# Patient Record
Sex: Female | Born: 1990 | Race: Asian | Marital: Single | Smoking: Never smoker
Health system: Southern US, Community
[De-identification: ages and names within clinical notes are randomized; demographics above are authoritative.]

---
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
## 2019-07-10 ENCOUNTER — Other Ambulatory Visit: Payer: Self-pay

## 2019-07-13 ENCOUNTER — Ambulatory Visit: Payer: Self-pay | Admitting: Physician Assistant

## 2019-08-17 ENCOUNTER — Ambulatory Visit (INDEPENDENT_AMBULATORY_CARE_PROVIDER_SITE_OTHER): Payer: 59 | Admitting: Physician Assistant

## 2019-08-17 ENCOUNTER — Other Ambulatory Visit: Payer: Self-pay

## 2019-08-17 ENCOUNTER — Encounter: Payer: Self-pay | Admitting: Physician Assistant

## 2019-08-17 VITALS — BP 122/88 | HR 96 | Temp 98.1°F | Ht <= 58 in | Wt 101.5 lb

## 2019-08-17 DIAGNOSIS — Z111 Encounter for screening for respiratory tuberculosis: Secondary | ICD-10-CM

## 2019-08-17 DIAGNOSIS — Z114 Encounter for screening for human immunodeficiency virus [HIV]: Secondary | ICD-10-CM | POA: Diagnosis not present

## 2019-08-17 DIAGNOSIS — Z1159 Encounter for screening for other viral diseases: Secondary | ICD-10-CM

## 2019-08-17 DIAGNOSIS — R05 Cough: Secondary | ICD-10-CM | POA: Diagnosis not present

## 2019-08-17 DIAGNOSIS — Z23 Encounter for immunization: Secondary | ICD-10-CM

## 2019-08-17 DIAGNOSIS — R059 Cough, unspecified: Secondary | ICD-10-CM

## 2019-08-17 DIAGNOSIS — R634 Abnormal weight loss: Secondary | ICD-10-CM

## 2019-08-17 LAB — CBC WITH DIFFERENTIAL/PLATELET
Basophils Absolute: 0.1 10*3/uL (ref 0.0–0.1)
Basophils Relative: 1 % (ref 0.0–3.0)
Eosinophils Absolute: 0.1 10*3/uL (ref 0.0–0.7)
Eosinophils Relative: 2.4 % (ref 0.0–5.0)
HCT: 34.1 % — ABNORMAL LOW (ref 36.0–46.0)
Hemoglobin: 10.9 g/dL — ABNORMAL LOW (ref 12.0–15.0)
Lymphocytes Relative: 34 % (ref 12.0–46.0)
Lymphs Abs: 1.9 10*3/uL (ref 0.7–4.0)
MCHC: 32 g/dL (ref 30.0–36.0)
MCV: 81.8 fl (ref 78.0–100.0)
Monocytes Absolute: 0.6 10*3/uL (ref 0.1–1.0)
Monocytes Relative: 10.7 % (ref 3.0–12.0)
Neutro Abs: 3 10*3/uL (ref 1.4–7.7)
Neutrophils Relative %: 51.9 % (ref 43.0–77.0)
Platelets: 422 10*3/uL — ABNORMAL HIGH (ref 150.0–400.0)
RBC: 4.17 Mil/uL (ref 3.87–5.11)
RDW: 13.5 % (ref 11.5–15.5)
WBC: 5.7 10*3/uL (ref 4.0–10.5)

## 2019-08-17 LAB — COMPREHENSIVE METABOLIC PANEL
ALT: 9 U/L (ref 0–35)
AST: 15 U/L (ref 0–37)
Albumin: 4.5 g/dL (ref 3.5–5.2)
Alkaline Phosphatase: 69 U/L (ref 39–117)
BUN: 13 mg/dL (ref 6–23)
CO2: 26 mEq/L (ref 19–32)
Calcium: 9.4 mg/dL (ref 8.4–10.5)
Chloride: 106 mEq/L (ref 96–112)
Creatinine, Ser: 0.63 mg/dL (ref 0.40–1.20)
GFR: 112.38 mL/min (ref >60.00)
Glucose, Bld: 93 mg/dL (ref 70–99)
Potassium: 4.5 mEq/L (ref 3.5–5.1)
Sodium: 138 mEq/L (ref 135–145)
Total Bilirubin: 0.5 mg/dL (ref 0.2–1.2)
Total Protein: 7.3 g/dL (ref 6.0–8.3)

## 2019-08-17 LAB — TSH: TSH: 2.42 u[IU]/mL (ref 0.35–4.50)

## 2019-08-17 MED ORDER — ALBUTEROL SULFATE HFA 108 (90 BASE) MCG/ACT IN AERS
2.0000 | INHALATION_SPRAY | Freq: Four times a day (QID) | RESPIRATORY_TRACT | 1 refills | Status: DC | PRN
Start: 2019-08-17 — End: 2021-09-21

## 2019-08-17 MED ORDER — PREDNISONE 20 MG PO TABS: 40.0000 mg | ORAL_TABLET | Freq: Every day | ORAL | 0 refills | Status: DC

## 2019-08-17 NOTE — Progress Notes (Signed)
Theresa Greer is a 29 y.o. female is here to establish care.  I acted as a Neurosurgeon for [employer], PA-C Corky Mull, LPN   History of Present Illness:   Chief Complaint  Patient presents with  . Establish Care  . Cough    HPI   Pt is here to establish care today.  Cough Pt is c/o dry non-productive cough x 1 month. Denies fever or chills, no nasal congestion. She has tried OTC (including antihistamine, mucinex) and home remedies with no relief. Smoked marijuana about 3 years ago.  No current smoking.  She reports that every year she gets this issue with these bronchospasms, and has never had work-up for it.  The more she talks the more she feels like she needs to cough.  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  Denies night sweats, abdominal pain, changes in bowels.  She is adopted and does not know her family history      Health Maintenance Due  Topic Date Due  . Hepatitis C Screening  Never done  . HIV Screening  Never done  . PAP-Cervical Cytology Screening  Never done  . PAP SMEAR-Modifier  Never done    History reviewed. No pertinent past medical history.   Social History   Tobacco Use  . Smoking status: Never Smoker  . Smokeless tobacco: Never Used  Vaping Use  . Vaping Use: Never used  Substance Use Topics  . Alcohol use: Yes    Comment: 2 drinks a week  . Drug use: Never    Past Surgical History:  Procedure Laterality Date  . WISDOM TOOTH EXTRACTION Bilateral 2012    Family History  Adopted: Yes  Family history unknown: Yes    PMHx, SurgHx, SocialHx, FamHx, Medications, and Allergies were reviewed in the Visit Navigator and updated as appropriate.   There are no problems to display for this patient.   Social History   Tobacco Use  . Smoking status: Never Smoker  .  Smokeless tobacco: Never Used  Vaping Use  . Vaping Use: Never used  Substance Use Topics  . Alcohol use: Yes    Comment: 2 drinks a week  . Drug use: Never    Current Medications and Allergies:    Current Outpatient Medications:  .  albuterol (VENTOLIN HFA) 108 (90 Base) MCG/ACT inhaler, Inhale 2 puffs into the lungs every 6 (six) hours as needed for wheezing or shortness of breath., Disp: 18 g, Rfl: 1 .  predniSONE (DELTASONE) 20 MG tablet, Take 2 tablets (40 mg total) by mouth daily., Disp: 10 tablet, Rfl: 0  No Known Allergies  Review of Systems   ROS Negative unless otherwise specified per HPI.  Vitals:   Vitals:   08/17/19 1058  BP: 122/88  Pulse: 96  Temp: 98.1 F (36.7 C)  TempSrc: Temporal  SpO2: 99%  Weight: 101 lb 8 oz (46 kg)  Height: 4' 9.75" (1.467 m)     Body mass index is 21.4 kg/m.   Physical Exam:    Physical Exam Vitals and nursing note reviewed.  Constitutional:      General: She is not in acute distress.    Appearance: She is well-developed. She is not ill-appearing or toxic-appearing.  Cardiovascular:     Rate and Rhythm: Normal rate  and regular rhythm.     Pulses: Normal pulses.     Heart sounds: Normal heart sounds, S1 normal and S2 normal.     Comments: No LE edema Pulmonary:     Effort: Pulmonary effort is normal.     Breath sounds: Normal breath sounds.  Skin:    General: Skin is warm and dry.  Neurological:     Mental Status: She is alert.     GCS: GCS eye subscore is 4. GCS verbal subscore is 5. GCS motor subscore is 6.  Psychiatric:        Speech: Speech normal.        Behavior: Behavior normal. Behavior is cooperative.      Assessment and Plan:    Kaedyn was seen today for establish care and cough.  Diagnoses and all orders for this visit:  Cough Possible seasonal allergy related cough vs upper airway cough syndrome vs other. Advise patient as follows -Start oral prednisone. -Start daily over the counter  antihistamines such as Zyrtec (cetirizine), Claritin (loratadine), Allegra (fexofenadine), or Xyzal (levocetirizine). -Use albuterol inhaler as needed for shortness of breath/severe coughing episodes -Start daily medication for your stomach such as pepcid or nexium (generic is fine!)  I also advised her to follow-up with me in 2 weeks to follow-up on this.  At that time we may consider chest x-ray and pulmonary consult. -     CBC with Differential/Platelet -     Comprehensive metabolic panel  Weight loss Encouraged healthy eating and adequate calorie consumption throughout the day.  Advised patient try to prevent further weight loss and that she is currently in a healthy weight.  Will follow up on her weight in 2 weeks as well. -     TSH  Screening for tuberculosis -     QuantiFERON-TB Gold Plus  Screening for HIV (human immunodeficiency virus) -     HIV Antibody (routine testing w rflx)  Encounter for screening for other viral diseases -     Hepatitis C antibody  Need for prophylactic vaccination with combined diphtheria-tetanus-pertussis (DTP) vaccine -     Tdap vaccine greater than or equal to 7yo IM  Other orders -     predniSONE (DELTASONE) 20 MG tablet; Take 2 tablets (40 mg total) by mouth daily. -     albuterol (VENTOLIN HFA) 108 (90 Base) MCG/ACT inhaler; Inhale 2 puffs into the lungs every 6 (six) hours as needed for wheezing or shortness of breath.    . Reviewed expectations re: course of current medical issues. . Discussed self-management of symptoms. . Outlined signs and symptoms indicating need for more acute intervention. . Patient verbalized understanding and all questions were answered. . See orders for this visit as documented in the electronic medical record. . Patient received an After Visit Summary.  CMA or LPN served as scribe during this visit. History, Physical, and Plan performed by medical provider. The above documentation has been reviewed and is  accurate and complete.   Jarold Motto, PA-C [place] 08/17/2019  Follow-up: No follow-ups on file.

## 2019-08-17 NOTE — Patient Instructions (Signed)
It was great to see you!  -Start oral prednisone. -Start daily over the counter antihistamines such as Zyrtec (cetirizine), Claritin (loratadine), Allegra (fexofenadine), or Xyzal (levocetirizine). -Use inhaler as needed for shortness of breath/severe coughing episodes -Start daily medication for your stomach such as pepcid or nexium (generic is fine!)   Follow-up with me in two weeks!  Take care,  Jarold Motto PA-C  Bronchospasm, Adult  Bronchospasm is when airways in the lungs get smaller. When this happens, it can be hard to breathe. You may cough. You may also make a whistling sound when you breathe (wheeze). Follow these instructions at home: Medicines  Take over-the-counter and prescription medicines only as told by your doctor.  If you need to use an inhaler or nebulizer to take your medicine, ask your doctor how to use it.  If you were given a spacer, always use it with your inhaler. Lifestyle  Change your heating and air conditioning filter. Do this at least once a month.  Try not to use fireplaces and wood stoves.  Do not  smoke. Do not  allow smoking in your home.  Try not to use things that have a strong smell, like perfume.  Get rid of pests (such as roaches and mice) and their poop.  Remove any mold from your home.  Keep your house clean. Get rid of dust.  Use cleaning products that have no smell.  Replace carpet with wood, tile, or vinyl flooring.  Use allergy-proof pillows, mattress covers, and box spring covers.  Wash bed sheets and blankets every week. Use hot water. Dry them in a dryer.  Use blankets that are made of polyester or cotton.  Wash your hands often.  Keep pets out of your bedroom.  When you exercise, try not to breathe in cold air. General instructions  Have a plan for getting medical care. Know these things: ? When to call your doctor. ? When to call local emergency services (911 in the [place] ? Where to go in an  emergency.  Stay up to date on your shots (immunizations).  When you have an episode: ? Stay calm. ? Relax. ? Breathe slowly. Contact a doctor if:  Your muscles ache.  Your chest hurts.  The color of the mucus you cough up (sputum) changes from clear or white to yellow, green, gray, or bloody.  The mucus you cough up gets thicker.  You have a fever. Get help right away if:  The whistling sound gets worse, even after you take your medicines.  Your coughing gets worse.  You find it even harder to breathe.  Your chest hurts very much. Summary  Bronchospasm is when airways in the lungs get smaller.  When this happens, it can be hard to breathe. You may cough. You may also make a whistling sound when you breathe.  Stay away from things that cause you to have episodes. These include smoke or dust. This information is not intended to replace advice given to you by your health care provider. Make sure you discuss any questions you have with your health care provider. Document Revised: 12/28/2016 Document Reviewed: 01/19/2016 Elsevier Patient Education  2020 [employer].

## 2019-08-18 ENCOUNTER — Telehealth: Payer: Self-pay | Admitting: Physician Assistant

## 2019-08-18 NOTE — Telephone Encounter (Signed)
Patient has dropped off Immunization form to be completed for school.  I have placed this in Clinical Pickup.  Please call patient once complete.

## 2019-08-19 LAB — QUANTIFERON-TB GOLD PLUS
Mitogen-NIL: >10 IU/mL
NIL: 0.15 IU/mL
QuantiFERON-TB Gold Plus: NEGATIVE
TB1-NIL: 0 IU/mL
TB2-NIL: <0 IU/mL

## 2019-08-19 LAB — HEPATITIS C ANTIBODY
Hepatitis C Ab: NONREACTIVE
SIGNAL TO CUT-OFF: 0.32 (ref <1.00)

## 2019-08-19 LAB — HIV ANTIBODY (ROUTINE TESTING W REFLEX): HIV 1&2 Ab, 4th Generation: NONREACTIVE

## 2019-08-20 ENCOUNTER — Telehealth: Payer: Self-pay | Admitting: Physician Assistant

## 2019-08-20 DIAGNOSIS — Z0184 Encounter for antibody response examination: Secondary | ICD-10-CM

## 2019-08-20 NOTE — Telephone Encounter (Signed)
Patient requested a copy of her immunization records she would like to come pick them up

## 2019-08-20 NOTE — Telephone Encounter (Signed)
Noted. Pt notified that we do not have all of her immunizations from [place]. Told her what was needed and she will try to get dates.

## 2019-08-21 NOTE — Telephone Encounter (Signed)
LVM for patient to return call.   Pt need to make a lab only appt for varicella titer to complete required immunization record for school.

## 2019-08-24 NOTE — Telephone Encounter (Signed)
Spoke with pt, she will just come in on 09/01/19 to get varicella titer done.

## 2019-09-01 ENCOUNTER — Encounter: Payer: Self-pay | Admitting: Physician Assistant

## 2019-09-01 ENCOUNTER — Other Ambulatory Visit: Payer: Self-pay

## 2019-09-01 ENCOUNTER — Ambulatory Visit (INDEPENDENT_AMBULATORY_CARE_PROVIDER_SITE_OTHER): Payer: 59 | Admitting: Physician Assistant

## 2019-09-01 VITALS — BP 110/72 | HR 91 | Temp 97.6°F | Ht <= 58 in | Wt 106.9 lb

## 2019-09-01 DIAGNOSIS — D509 Iron deficiency anemia, unspecified: Secondary | ICD-10-CM

## 2019-09-01 DIAGNOSIS — R94120 Abnormal auditory function study: Secondary | ICD-10-CM | POA: Diagnosis not present

## 2019-09-01 DIAGNOSIS — Z23 Encounter for immunization: Secondary | ICD-10-CM

## 2019-09-01 DIAGNOSIS — R05 Cough: Secondary | ICD-10-CM

## 2019-09-01 DIAGNOSIS — R059 Cough, unspecified: Secondary | ICD-10-CM

## 2019-09-01 DIAGNOSIS — R634 Abnormal weight loss: Secondary | ICD-10-CM | POA: Diagnosis not present

## 2019-09-01 MED ORDER — QVAR REDIHALER 80 MCG/ACT IN AERB: 1.0000 | INHALATION_SPRAY | Freq: Two times a day (BID) | RESPIRATORY_TRACT | 1 refills | Status: DC

## 2019-09-01 NOTE — Progress Notes (Signed)
Theresa Greer is a 29 y.o. female is here to discuss:  SCRIBE STATEMENT  History of Present Illness:   Chief Complaint  Patient presents with  . Cough    HPI   Cough Has improved since she has saw me. Took the oral prednisone and tolerated without any significant issues. She did not start the oral antihistamine, states that it had not helped her in the past. She is having to use her albuterol inhaler about twice per day still. She can tolerate talking better, whereas before she had issues.  Weight loss She has gained 5 lb since she last saw me about 1 month ago. She states that she is being more intentional about eating better, and eating more. She has maintained her regular amount of physical activity that her job requires.  Iron deficiency She was recommended to start oral iron but she felt like it caused her to have unusual temperature intolerance. No GI side effects. When she stopped the iron supplement her temperature intolerance issues resolved. She does eat very healthy but admits to heavy periods during the first two days of her cycle.  L ear concerns She reports that she hears "static" in her L ear. She reports that she has concerns that she cannot hear out of this ear as well as the other. 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    Health Maintenance Due  Topic Date Due  . PAP-Cervical Cytology Screening  Never done  . PAP SMEAR-Modifier  Never done  . INFLUENZA VACCINE  08/30/2019    History reviewed. No pertinent past medical history.   Social History   Tobacco Use  . Smoking status: Never Smoker  . Smokeless tobacco: Never Used  Vaping Use  . Vaping Use: Never used  Substance Use Topics  . Alcohol use: Yes    Comment: 2 drinks a week  . Drug use: Never    Past Surgical History:  Procedure Laterality Date  . WISDOM TOOTH EXTRACTION Bilateral 2012    Family History  Adopted: Yes  Family  history unknown: Yes    PMHx, SurgHx, SocialHx, FamHx, Medications, and Allergies were reviewed in the Visit Navigator and updated as appropriate.   There are no problems to display for this patient.   Social History   Tobacco Use  . Smoking status: Never Smoker  . Smokeless tobacco: Never Used  Vaping Use  . Vaping Use: Never used  Substance Use Topics  . Alcohol use: Yes    Comment: 2 drinks a week  . Drug use: Never    Current Medications and Allergies:    Current Outpatient Medications:  .  albuterol (VENTOLIN HFA) 108 (90 Base) MCG/ACT inhaler, Inhale 2 puffs into the lungs every 6 (six) hours as needed for wheezing or shortness of breath., Disp: 18 g, Rfl: 1 .  beclomethasone (QVAR REDIHALER) 80 MCG/ACT inhaler, Inhale 1 puff into the lungs 2 (two) times daily., Disp: 10.6 g, Rfl: 1 .  predniSONE (DELTASONE) 20 MG tablet, Take 2 tablets (40 mg total) by mouth daily. (Patient not taking: Reported on 09/01/2019), Disp: 10 tablet, Rfl: 0   Allergies  Allergen Reactions  . Motrin [Ibuprofen] Rash  . Penicillins Rash    Review of Systems   ROS Negative unless otherwise specified per HPI.  Vitals:   Vitals:   09/01/19 0844  BP: 110/72  Pulse: 91  Temp: 97.6 F (36.4 C)  TempSrc: Temporal  SpO2: 99%  Weight: 106 lb 14.4 oz (48.5 kg)  Height: 4' 9.75" (1.467 m)     Body mass index is 22.54 kg/m.   Physical Exam:    Physical Exam Vitals and nursing note reviewed.  Constitutional:      General: She is not in acute distress.    Appearance: She is well-developed. She is not ill-appearing or toxic-appearing.  HENT:     Right Ear: Tympanic membrane, ear canal and external ear normal. Tympanic membrane is not injected, scarred or perforated.     Left Ear: Tympanic membrane, ear canal and external ear normal. Tympanic membrane is not injected, scarred or perforated.  Cardiovascular:     Rate and Rhythm: Normal rate and regular rhythm.     Pulses: Normal  pulses.     Heart sounds: Normal heart sounds, S1 normal and S2 normal.     Comments: No LE edema Pulmonary:     Effort: Pulmonary effort is normal.     Breath sounds: Normal breath sounds.  Skin:    General: Skin is warm and dry.  Neurological:     Mental Status: She is alert.     GCS: GCS eye subscore is 4. GCS verbal subscore is 5. GCS motor subscore is 6.  Psychiatric:        Speech: Speech normal.        Behavior: Behavior normal. Behavior is cooperative.      Assessment and Plan:    Jewell was seen today for cough.  Diagnoses and all orders for this visit:  Failed hearing screening Will refer to audiology for further evaluation and management. -     Ambulatory referral to Audiology  Cough Improved but she is still using her albuterol inhaler frequently. Start QVAR in am and pm. Start antihistamine.  Follow-up in 1 month if no improvement, sooner if concerns. Will likely send to pulmonary.  Weight loss Weight has stabilized.  Continue conscious efforts to eat and drink healthy as able. Continue to monitor.  Iron deficiency anemia, unspecified iron deficiency anemia type Consider Slow Fe iron to see if this helps her symptoms. Follow-up 3-6 months to recheck CBC, sooner if concerns.  Other orders -     beclomethasone (QVAR REDIHALER) 80 MCG/ACT inhaler; Inhale 1 puff into the lungs 2 (two) times daily. -     Tdap vaccine greater than or equal to 7yo IM -     Meningococcal B, OMV (Bexsero)   . Reviewed expectations re: course of current medical issues. . Discussed self-management of symptoms. . Outlined signs and symptoms indicating need for more acute intervention. . Patient verbalized understanding and all questions were answered. . See orders for this visit as documented in the electronic medical record. . Patient received an After Visit Summary.  CMA or LPN served as scribe during this visit. History, Physical, and Plan performed by medical provider. The  above documentation has been reviewed and is accurate and complete.   Jarold Motto, PA-C [place], [facility] 09/01/2019  Follow-up: No follow-ups on file.

## 2019-09-01 NOTE — Patient Instructions (Addendum)
It was great to see you!  Start over the counter antihistamines such as Zyrtec (cetirizine), Claritin (loratadine), Allegra (fexofenadine), or Xyzal (levocetirizine) daily as needed. May take twice a day if needed as long as it does not cause drowsiness.  Start QVAR twice daily -- if this is too expensive, let me know. The goal is to use the Albuterol no more than twice per week.  If cough worsens, or does not improve, let me know and we will refer to pulmonary.  I would like for you to have a formal hearing exam, I will put in a referral for this.  Consider taking the Slow Fe iron supplement (as shown to [facility]yu on [place]  Vaccines: Today you received TDap and your first Meningitis B vaccine. Please follow-up in 1 month for your second Meningitis B vaccine and we will start another vaccine as well. We will still need to update your Varicella and Hep A vaccines.  Tentative schedule: August 3rd: Tdap and first Bexsero 1 month later: second Bexsero and first Varicella 3 month later: second Varicella and first Hep A 6 months later: second Hep A   Take care,  Jarold Motto PA-C

## 2019-10-06 ENCOUNTER — Ambulatory Visit: Payer: 59

## 2019-10-13 ENCOUNTER — Ambulatory Visit (INDEPENDENT_AMBULATORY_CARE_PROVIDER_SITE_OTHER): Payer: 59

## 2019-10-13 ENCOUNTER — Other Ambulatory Visit: Payer: Self-pay

## 2019-10-13 DIAGNOSIS — Z23 Encounter for immunization: Secondary | ICD-10-CM | POA: Diagnosis not present

## 2019-11-12 ENCOUNTER — Other Ambulatory Visit: Payer: Self-pay

## 2019-11-12 ENCOUNTER — Ambulatory Visit: Payer: 59

## 2019-11-26 ENCOUNTER — Telehealth (INDEPENDENT_AMBULATORY_CARE_PROVIDER_SITE_OTHER): Payer: 59 | Admitting: Family Medicine

## 2019-11-26 ENCOUNTER — Other Ambulatory Visit: Payer: Self-pay

## 2019-11-26 DIAGNOSIS — M25422 Effusion, left elbow: Secondary | ICD-10-CM

## 2019-11-26 DIAGNOSIS — W540XXA Bitten by dog, initial encounter: Secondary | ICD-10-CM

## 2019-11-26 DIAGNOSIS — S51002A Unspecified open wound of left elbow, initial encounter: Secondary | ICD-10-CM | POA: Diagnosis not present

## 2019-11-26 NOTE — Patient Instructions (Signed)
Please go to the urgent care right away for an in person evaluation.   I hope you are feeling better soon!    It was nice to meet you today. I help [facility] out with telemedicine visits on Tuesdays and Thursdays and am available for visits on those days. If you have any concerns or questions following this visit please schedule a follow up visit with your Primary Care doctor or seek care at a local urgent care clinic to avoid delays in care.

## 2019-11-26 NOTE — Progress Notes (Signed)
Virtual Visit via Video Note  I connected with Theresa Greer  on 11/26/19 at 10:40 AM EDT by a video enabled telemedicine application and verified that I am speaking with the correct person using two identifiers.  Location patient: home Location provider:work or home office Persons participating in the virtual visit: patient, provider  I discussed the limitations of evaluation and management by telemedicine and the availability of in person appointments. The patient expressed understanding and agreed to proceed.   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 Dog is contained. 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  No past medical history on file.  Past Surgical History:  Procedure Laterality Date  . WISDOM TOOTH EXTRACTION Bilateral 2012     Current Outpatient Medications:  .  albuterol (VENTOLIN HFA) 108 (90 Base) MCG/ACT inhaler, Inhale 2 puffs into the lungs every 6 (six) hours as needed for wheezing or shortness of breath., Disp: 18 g, Rfl: 1 .  beclomethasone (QVAR REDIHALER) 80 MCG/ACT inhaler, Inhale 1 puff into the lungs 2 (two) times daily., Disp: 10.6 g, Rfl: 1 .  predniSONE (DELTASONE) 20 MG tablet, Take 2 tablets (40 mg total) by mouth daily.  (Patient not taking: Reported on 09/01/2019), Disp: 10 tablet, Rfl: 0  EXAM:  VITALS per patient if applicable:  GENERAL: alert, oriented, appears well and in no acute distress  HEENT: atraumatic, conjunttiva clear, no obvious abnormalities on inspection of external nose and ears  NECK: normal movements of the head and neck  LUNGS: on inspection no signs of respiratory distress, breathing rate appears normal, no obvious gross SOB, gasping or wheezing  CV: no obvious cyanosis  MS: moves all visible extremities without noticeable abnormality  SKIN: She has what looks to be several puncture bite wounds around and over the elbow, including several on the olecranon process area, she has some erythema and swelling over the olecranon and some significant bruising and swelling around the area.  There is blood on the dressing.  I do not appreciate any active bleeding at the wound sites, though exam is limited.  She does report pain with flexion and extension of the elbow.  PSYCH/NEURO: pleasant and cooperative, no obvious depression or anxiety, speech and thought processing grossly intact  ASSESSMENT AND PLAN:  Discussed the following assessment and plan:  Open wound of left elbow, initial encounter  Swelling of left elbow  Dog bite, initial encounter  -we discussed possible serious and likely etiologies, options for evaluation and workup, limitations of telemedicine visit vs in person visit, treatment, treatment risks and precautions. Pt prefers to treat via telemedicine empirically rather than in person at this moment.  Given location of the bite over a joint and a bursa with swelling and erythema, advised of concern  for infection, possibly involving important structures, and in person evaluation for better exam and possible labs/imaging versus referral along with the management.  She reports bite report has already been filed.  She agrees to seek care in his urgent care today, given her  primary care office did not have any available in person visits.  Discussed options for various urgent care sites.  She agrees to go now after this visit.  Advised to seek prompt in person care if worsening, new symptoms arise, or if is not improving with treatment. Discussed options for inperson care if PCP office not available. Did let this patient know that I only do telemedicine on Tuesdays and Thursdays for [place]. Advised to schedule follow up visit with PCP or [facility] if any further questions or concerns to avoid delays in care.   I discussed the assessment and treatment plan with the patient.  Spent over 20 minutes on this visit.  The patient was provided an opportunity to ask questions and all were answered. The patient agreed with the plan and demonstrated an understanding of the instructions.     Terressa Koyanagi, DO

## 2020-01-05 ENCOUNTER — Ambulatory Visit: Payer: 59

## 2020-02-04 ENCOUNTER — Ambulatory Visit: Payer: 59 | Admitting: Family Medicine

## 2020-02-15 ENCOUNTER — Ambulatory Visit: Payer: 59 | Admitting: Family Medicine

## 2020-06-30 ENCOUNTER — Ambulatory Visit: Payer: 59

## 2021-01-31 ENCOUNTER — Other Ambulatory Visit: Payer: Self-pay

## 2021-01-31 ENCOUNTER — Ambulatory Visit (INDEPENDENT_AMBULATORY_CARE_PROVIDER_SITE_OTHER): Payer: BC Managed Care – PPO

## 2021-01-31 ENCOUNTER — Ambulatory Visit (INDEPENDENT_AMBULATORY_CARE_PROVIDER_SITE_OTHER): Payer: BC Managed Care – PPO | Admitting: Physician Assistant

## 2021-01-31 ENCOUNTER — Encounter: Payer: Self-pay | Admitting: Physician Assistant

## 2021-01-31 VITALS — BP 110/80 | HR 104 | Temp 97.8°F | Ht <= 58 in | Wt 96.0 lb

## 2021-01-31 DIAGNOSIS — M25552 Pain in left hip: Secondary | ICD-10-CM

## 2021-01-31 MED ORDER — METHYLPREDNISOLONE ACETATE 40 MG/ML IJ SUSP
40.0000 mg | Freq: Once | INTRAMUSCULAR | Status: AC
  Administered 2021-01-31: 40 mg via INTRAMUSCULAR

## 2021-01-31 NOTE — Progress Notes (Signed)
Theresa Greer is a 31 y.o. female here for leg pain.   History of Present Illness:   Chief Complaint  Patient presents with   Hip Injury    Pt fell on her left hip down steps on 12/24,  Shooting pain.    HPI  Left Hip Pain Theresa Greer presents with c/o left leg pain following a fall on 12/31. States she was walking down some stairs and while as she reached the last two she lost her footing and landed on her left hip. Pt describes pain as a shooting sensation radiating down to her mid thigh area. Following her fall, she hasn't noticed any bruising. Theresa Greer has not taken anything for her sx at this time. Denies bowel/urinary incontinence, numbness/tingling. She states that she has not had rest from work since this happened -- works at [employer] and does a lot of walking/bending/lifting.  She has motrin allergy -- causes rash and increased pain.  History reviewed. No pertinent past medical history.   Social History   Tobacco Use   Smoking status: Never   Smokeless tobacco: Never  Vaping Use   Vaping Use: Never used  Substance Use Topics   Alcohol use: Yes    Comment: 2 drinks a week   Drug use: Never    Past Surgical History:  Procedure Laterality Date   WISDOM TOOTH EXTRACTION Bilateral 2012    Family History  Adopted: Yes  Family history unknown: Yes    Allergies  Allergen Reactions   Motrin [Ibuprofen] Rash   Penicillins Rash    Current Medications:   Current Outpatient Medications:    albuterol (VENTOLIN HFA) 108 (90 Base) MCG/ACT inhaler, Inhale 2 puffs into the lungs every 6 (six) hours as needed for wheezing or shortness of breath. (Patient not taking: Reported on 01/31/2021), Disp: 18 g, Rfl: 1   beclomethasone (QVAR REDIHALER) 80 MCG/ACT inhaler, Inhale 1 puff into the lungs 2 (two) times daily. (Patient not taking: Reported on 01/31/2021), Disp: 10.6 g, Rfl: 1   predniSONE (DELTASONE) 20 MG tablet, Take 2 tablets (40 mg total) by mouth daily. (Patient not taking:  Reported on 01/31/2021), Disp: 10 tablet, Rfl: 0   Review of Systems:   ROS Negative unless otherwise specified per HPI. Vitals:   Vitals:   01/31/21 1402  BP: 110/80  Pulse: (!) 104  Temp: 97.8 F (36.6 C)  TempSrc: Temporal  SpO2: 98%  Weight: 96 lb (43.5 kg)  Height: 4\' 9"  (1.448 m)     Body mass index is 20.77 kg/m.  Physical Exam:   Physical Exam Constitutional:      Appearance: Normal appearance. She is well-developed.  HENT:     Head: Normocephalic and atraumatic.  Eyes:     General: Lids are normal.     Extraocular Movements: Extraocular movements intact.     Conjunctiva/sclera: Conjunctivae normal.  Pulmonary:     Effort: Pulmonary effort is normal.  Musculoskeletal:        General: Normal range of motion.     Cervical back: Normal range of motion and neck supple.     Lumbar back: No bony tenderness.     Comments: Tenderness to posterior aspect of lateral left hip No obvious deformity Decreased ROM 2/2 pain Antalgic gait  Skin:    General: Skin is warm and dry.  Neurological:     Mental Status: She is alert and oriented to person, place, and time.  Psychiatric:        Attention and  Perception: Attention and perception normal.        Mood and Affect: Mood normal.        Behavior: Behavior normal.        Thought Content: Thought content normal.        Judgment: Judgment normal.    Assessment and Plan:   Acute Left Hip Pain  New; uncontrolled Administered DepoMedrol 40 mg injection due to NSAID allergy, patient tolerated well Start tylenol as needed Ordered DG Hip Unilat w or w/o Pelvis Left for further evaluation, will make recommendations accordingly  I have written her out of work for one week, however this is pending improvement of symptoms Low threshold to refer to sports medicine vs ortho if indicated Informed patient if new or worsening symptoms such as urinary/bowel incontinence occur, to visit the ER immediately   I,Havlyn C  Ratchford,acting as a scribe for [employer], PA.,have documented all relevant documentation on the behalf of Inda Coke, PA,as directed by  Inda Coke, PA while in the presence of Inda Coke, [place].  I, Inda Coke, [place], have reviewed all documentation for this visit. The documentation on 01/31/21 for the exam, diagnosis, procedures, and orders are all accurate and complete.   Inda Coke, PA-C

## 2021-01-31 NOTE — Patient Instructions (Signed)
It was great to see you!  Steroid injection today for your pain  Tylenol as needed for additional pain relief  Xray: [facility] office [address] [place], [place], [place] [address] [phone]  I will be in touch with your results  Take it easy and avoid excessive activity x 1 week  If new/worsening symptoms -- please let me know or seek urgent care  Take care,  Jarold Motto PA-C

## 2021-02-16 NOTE — Progress Notes (Incomplete)
Theresa Greer is a 31 y.o. female here for a sore throat.  SCRIBE STATEMENT  History of Present Illness:   No chief complaint on file.   HPI Sore Throat Theresa Greer presents with c/o sore throat that has been onset for ***. Pt describes sore throat as though ***. Currently expresses belief that she could have strept due to ***. Denies ***.  No past medical history on file.   Social History   Tobacco Use   Smoking status: Never   Smokeless tobacco: Never  Vaping Use   Vaping Use: Never used  Substance Use Topics   Alcohol use: Yes    Comment: 2 drinks a week   Drug use: Never    Past Surgical History:  Procedure Laterality Date   WISDOM TOOTH EXTRACTION Bilateral 2012    Family History  Adopted: Yes  Family history unknown: Yes    Allergies  Allergen Reactions   Motrin [Ibuprofen] Rash   Penicillins Rash    Current Medications:   Current Outpatient Medications:    albuterol (VENTOLIN HFA) 108 (90 Base) MCG/ACT inhaler, Inhale 2 puffs into the lungs every 6 (six) hours as needed for wheezing or shortness of breath. (Patient not taking: Reported on 01/31/2021), Disp: 18 g, Rfl: 1   beclomethasone (QVAR REDIHALER) 80 MCG/ACT inhaler, Inhale 1 puff into the lungs 2 (two) times daily. (Patient not taking: Reported on 01/31/2021), Disp: 10.6 g, Rfl: 1   predniSONE (DELTASONE) 20 MG tablet, Take 2 tablets (40 mg total) by mouth daily. (Patient not taking: Reported on 01/31/2021), Disp: 10 tablet, Rfl: 0   Review of Systems:   ROS Negative unless otherwise specified per HPI. Vitals:   There were no vitals filed for this visit.   There is no height or weight on file to calculate BMI.  Physical Exam:   Physical Exam  Assessment and Plan:        I,Havlyn C Ratchford,acting as a scribe for Inda Coke, PA.,have documented all relevant documentation on the behalf of Inda Coke, PA,as directed by  Inda Coke, PA while in the presence of Inda Coke,  [place].  ***  Inda Coke, PA-C

## 2021-02-17 ENCOUNTER — Ambulatory Visit: Payer: BC Managed Care – PPO | Admitting: Physician Assistant

## 2021-09-05 ENCOUNTER — Encounter: Payer: BC Managed Care – PPO | Admitting: Physician Assistant

## 2021-09-13 ENCOUNTER — Encounter: Payer: BC Managed Care – PPO | Admitting: Physician Assistant

## 2021-09-21 ENCOUNTER — Ambulatory Visit (INDEPENDENT_AMBULATORY_CARE_PROVIDER_SITE_OTHER): Payer: BC Managed Care – PPO | Admitting: Physician Assistant

## 2021-09-21 ENCOUNTER — Encounter: Payer: Self-pay | Admitting: Physician Assistant

## 2021-09-21 VITALS — BP 104/78 | HR 75 | Temp 98.0°F | Ht <= 58 in | Wt 96.0 lb

## 2021-09-21 DIAGNOSIS — Z136 Encounter for screening for cardiovascular disorders: Secondary | ICD-10-CM | POA: Diagnosis not present

## 2021-09-21 DIAGNOSIS — Z13220 Encounter for screening for lipoid disorders: Secondary | ICD-10-CM | POA: Diagnosis not present

## 2021-09-21 DIAGNOSIS — F411 Generalized anxiety disorder: Secondary | ICD-10-CM | POA: Diagnosis not present

## 2021-09-21 DIAGNOSIS — Z0001 Encounter for general adult medical examination with abnormal findings: Secondary | ICD-10-CM

## 2021-09-21 LAB — CBC WITH DIFFERENTIAL/PLATELET
Basophils Absolute: 0.1 10*3/uL (ref 0.0–0.1)
Basophils Relative: 1.2 % (ref 0.0–3.0)
Eosinophils Absolute: 0.2 10*3/uL (ref 0.0–0.7)
Eosinophils Relative: 3.9 % (ref 0.0–5.0)
HCT: 37.3 % (ref 36.0–46.0)
Hemoglobin: 12.1 g/dL (ref 12.0–15.0)
Lymphocytes Relative: 42.4 % (ref 12.0–46.0)
Lymphs Abs: 2.1 10*3/uL (ref 0.7–4.0)
MCHC: 32.4 g/dL (ref 30.0–36.0)
MCV: 81.2 fl (ref 78.0–100.0)
Monocytes Absolute: 0.5 10*3/uL (ref 0.1–1.0)
Monocytes Relative: 9.5 % (ref 3.0–12.0)
Neutro Abs: 2.1 10*3/uL (ref 1.4–7.7)
Neutrophils Relative %: 43 % (ref 43.0–77.0)
Platelets: 469 10*3/uL — ABNORMAL HIGH (ref 150.0–400.0)
RBC: 4.59 Mil/uL (ref 3.87–5.11)
RDW: 13.6 % (ref 11.5–15.5)
WBC: 4.8 10*3/uL (ref 4.0–10.5)

## 2021-09-21 LAB — COMPREHENSIVE METABOLIC PANEL
ALT: 10 U/L (ref 0–35)
AST: 18 U/L (ref 0–37)
Albumin: 4.5 g/dL (ref 3.5–5.2)
Alkaline Phosphatase: 75 U/L (ref 39–117)
BUN: 10 mg/dL (ref 6–23)
CO2: 28 mEq/L (ref 19–32)
Calcium: 9.6 mg/dL (ref 8.4–10.5)
Chloride: 102 mEq/L (ref 96–112)
Creatinine, Ser: 0.65 mg/dL (ref 0.40–1.20)
GFR: 118.27 mL/min (ref >60.00)
Glucose, Bld: 75 mg/dL (ref 70–99)
Potassium: 4.8 mEq/L (ref 3.5–5.1)
Sodium: 137 mEq/L (ref 135–145)
Total Bilirubin: 0.3 mg/dL (ref 0.2–1.2)
Total Protein: 8.2 g/dL (ref 6.0–8.3)

## 2021-09-21 LAB — LIPID PANEL
Cholesterol: 182 mg/dL (ref 0–200)
HDL: 102.3 mg/dL (ref >39.00)
LDL Cholesterol: 68 mg/dL (ref 0–99)
NonHDL: 80.14
Total CHOL/HDL Ratio: 2
Triglycerides: 61 mg/dL (ref 0.0–149.0)
VLDL: 12.2 mg/dL (ref 0.0–40.0)

## 2021-09-21 MED ORDER — SERTRALINE HCL 25 MG PO TABS: 25.0000 mg | ORAL_TABLET | Freq: Every day | ORAL | 3 refills | Status: AC

## 2021-09-21 NOTE — Progress Notes (Signed)
Subjective:    Theresa Greer is a 31 y.o. female and is here for a comprehensive physical exam.  HPI  There are no preventive care reminders to display for this patient.  Acute Concerns: None  Chronic Issues: GAD -- having to do a lot of work due to staffing issues, feeling irritable. Not socializing with her family when she gets home. Has been going on for about a month. Last scheduled day off she ended up getting called in and working for 10-11 hours. She may be interested in medications at this time.  Health Maintenance: Immunizations -- UTD PAP -- never Diet -- busy at work and eats once a day Sleep habits -- no major concerns Exercise -- very physical job Current Weight -- Weight: 96 lb (43.5 kg)  Weight History: Wt Readings from Last 10 Encounters:  09/21/21 96 lb (43.5 kg)  01/31/21 96 lb (43.5 kg)  09/01/19 106 lb 14.4 oz (48.5 kg)  08/17/19 101 lb 8 oz (46 kg)   Body mass index is 20.77 kg/m. Mood -- anxiety, see above  Patient's last menstrual period was 09/18/2021 (exact date). Period characteristics -- recently periods have become longer in duration Birth control -- none    reports current alcohol use.  Tobacco Use: Low Risk  (09/21/2021)   Patient History    Smoking Tobacco Use: Never    Smokeless Tobacco Use: Never    Passive Exposure: Not on file        09/21/2021   10:34 AM  Depression screen PHQ 2/9  Decreased Interest 0  Down, Depressed, Hopeless 0  PHQ - 2 Score 0     Other providers/specialists: Patient Care Team: Jarold Motto, [place] as PCP - General (Physician Assistant)   PMHx, SurgHx, SocialHx, Medications, and Allergies were reviewed in the Visit Navigator and updated as appropriate.   History reviewed. No pertinent past medical history.   Past Surgical History:  Procedure Laterality Date   WISDOM TOOTH EXTRACTION Bilateral 2012     Family History  Adopted: Yes  Family history unknown: Yes    Social History    Tobacco Use   Smoking status: Never   Smokeless tobacco: Never  Vaping Use   Vaping Use: Never used  Substance Use Topics   Alcohol use: Yes    Comment: 2 drinks a week   Drug use: Never    Review of Systems:   Review of Systems  Constitutional:  Negative for chills, fever, malaise/fatigue and weight loss.  HENT:  Negative for hearing loss, sinus pain and sore throat.   Respiratory:  Negative for cough and hemoptysis.   Cardiovascular:  Negative for chest pain, palpitations, leg swelling and PND.  Gastrointestinal:  Negative for abdominal pain, constipation, diarrhea, heartburn, nausea and vomiting.  Genitourinary:  Negative for dysuria, frequency and urgency.  Musculoskeletal:  Negative for back pain, myalgias and neck pain.  Skin:  Negative for itching and rash.  Neurological:  Negative for dizziness, tingling, seizures and headaches.  Endo/Heme/Allergies:  Negative for polydipsia.  Psychiatric/Behavioral:  Negative for depression. The patient is not nervous/anxious.     Objective:   BP 104/78 (BP Location: Left Arm, Patient Position: Sitting, Cuff Size: Normal)   Pulse 75   Temp 98 F (36.7 C) (Temporal)   Ht 4\' 9"  (1.448 m)   Wt 96 lb (43.5 kg)   LMP 09/18/2021 (Exact Date)   SpO2 100%   BMI 20.77 kg/m   General Appearance:    Alert, cooperative,  no distress, appears stated age  Head:    Normocephalic, without obvious abnormality, atraumatic  Eyes:    PERRL, conjunctiva/corneas clear, EOM's intact, fundi    benign, both eyes  Ears:    Normal TM's and external ear canals, both ears  Nose:   Nares normal, septum midline, mucosa normal, no drainage    or sinus tenderness  Throat:   Lips, mucosa, and tongue normal; teeth and gums normal  Neck:   Supple, symmetrical, trachea midline, no adenopathy;    thyroid:  no enlargement/tenderness/nodules; no carotid   bruit or JVD  Back:     Symmetric, no curvature, ROM normal, no CVA tenderness  Lungs:     Clear to  auscultation bilaterally, respirations unlabored  Chest Wall:    No tenderness or deformity   Heart:    Regular rate and rhythm, S1 and S2 normal, no murmur, rub   or gallop  Breast Exam:    Deferred  Abdomen:     Soft, non-tender, bowel sounds active all four quadrants,    no masses, no organomegaly  Genitalia:    Deferred  Rectal:    Deferred  Extremities:   Extremities normal, atraumatic, no cyanosis or edema  Pulses:   2+ and symmetric all extremities  Skin:   Skin color, texture, turgor normal, no rashes or lesions  Lymph nodes:   Cervical, supraclavicular, and axillary nodes normal  Neurologic:   CNII-XII intact, normal strength, sensation and reflexes    throughout    Assessment/Plan:   Encounter for general adult medical examination with abnormal findings Today patient counseled on age appropriate routine health concerns for screening and prevention, each reviewed and up to date or declined. Immunizations reviewed and up to date or declined. Labs ordered and reviewed. Risk factors for depression reviewed and negative. Hearing function and visual acuity are intact. ADLs screened and addressed as needed. Functional ability and level of safety reviewed and appropriate. Education, counseling and referrals performed based on assessed risks today. Patient provided with a copy of personalized plan for preventive services.  GAD (generalized anxiety disorder) Uncontrolled  Start zoloft 25 mg daily Follow-up in 6 weeks  Encounter for lipid screening for cardiovascular disease Update lipid panel  Patient Counseling:   [x]     Nutrition: Stressed importance of moderation in sodium/caffeine intake, saturated fat and cholesterol, caloric balance, sufficient intake of fresh fruits, vegetables, fiber, calcium, iron, and 1 mg of folate supplement per day (for females capable of pregnancy).   [x]      Stressed the importance of regular exercise.    [x]     Substance Abuse: Discussed  cessation/primary prevention of tobacco, alcohol, or other drug use; driving or other dangerous activities under the influence; availability of treatment for abuse.    [x]      Injury prevention: Discussed safety belts, safety helmets, smoke detector, smoking near bedding or upholstery.    [x]      Sexuality: Discussed sexually transmitted diseases, partner selection, use of condoms, avoidance of unintended pregnancy  and contraceptive alternatives.    [x]     Dental health: Discussed importance of regular tooth brushing, flossing, and dental visits.   [x]      Health maintenance and immunizations reviewed. Please refer to Health maintenance section.   , PA-C [facility]

## 2021-09-21 NOTE — Patient Instructions (Signed)
It was great to see you!  Start zoloft 25 mg daily  Tell someone you trust that you are starting this  Follow-up in 6 weeks for PAP and check-in on zoloft  Please go to the lab for blood work.   Our office will call you with your results unless you have chosen to receive results via MyChart.  If your blood work is normal we will follow-up each year for physicals and as scheduled for chronic medical problems.  If anything is abnormal we will treat accordingly and get you in for a follow-up.  Take care,  Lelon Mast

## 2021-10-02 DIAGNOSIS — R0602 Shortness of breath: Secondary | ICD-10-CM | POA: Diagnosis not present

## 2021-10-02 DIAGNOSIS — Z6820 Body mass index (BMI) 20.0-20.9, adult: Secondary | ICD-10-CM | POA: Diagnosis not present

## 2021-10-02 DIAGNOSIS — R6883 Chills (without fever): Secondary | ICD-10-CM | POA: Diagnosis not present

## 2021-10-02 DIAGNOSIS — F4321 Adjustment disorder with depressed mood: Secondary | ICD-10-CM | POA: Diagnosis not present

## 2021-10-03 ENCOUNTER — Encounter: Payer: Self-pay | Admitting: Family

## 2021-10-03 ENCOUNTER — Ambulatory Visit (HOSPITAL_BASED_OUTPATIENT_CLINIC_OR_DEPARTMENT_OTHER)
Admission: RE | Admit: 2021-10-03 | Discharge: 2021-10-03 | Disposition: A | Discharge status: Home / Self Care | Payer: BC Managed Care – PPO | Source: Ambulatory Visit | Attending: Family | Admitting: Family

## 2021-10-03 ENCOUNTER — Telehealth: Payer: Self-pay | Admitting: Physician Assistant

## 2021-10-03 ENCOUNTER — Ambulatory Visit: Payer: BC Managed Care – PPO | Admitting: Family

## 2021-10-03 VITALS — BP 100/68 | HR 102 | Temp 100.1°F | Ht <= 58 in | Wt 94.8 lb

## 2021-10-03 DIAGNOSIS — R1030 Lower abdominal pain, unspecified: Secondary | ICD-10-CM | POA: Diagnosis not present

## 2021-10-03 DIAGNOSIS — R109 Unspecified abdominal pain: Secondary | ICD-10-CM | POA: Diagnosis not present

## 2021-10-03 DIAGNOSIS — R509 Fever, unspecified: Secondary | ICD-10-CM | POA: Insufficient documentation

## 2021-10-03 DIAGNOSIS — R112 Nausea with vomiting, unspecified: Secondary | ICD-10-CM | POA: Diagnosis not present

## 2021-10-03 DIAGNOSIS — K5900 Constipation, unspecified: Secondary | ICD-10-CM

## 2021-10-03 LAB — CBC WITH DIFFERENTIAL/PLATELET
Basophils Absolute: 0 10*3/uL (ref 0.0–0.1)
Basophils Relative: 0.2 % (ref 0.0–3.0)
Eosinophils Absolute: 0 10*3/uL (ref 0.0–0.7)
Eosinophils Relative: 0 % (ref 0.0–5.0)
HCT: 34.2 % — ABNORMAL LOW (ref 36.0–46.0)
Hemoglobin: 11.1 g/dL — ABNORMAL LOW (ref 12.0–15.0)
Lymphocytes Relative: 10 % — ABNORMAL LOW (ref 12.0–46.0)
Lymphs Abs: 1.7 10*3/uL (ref 0.7–4.0)
MCHC: 32.5 g/dL (ref 30.0–36.0)
MCV: 80 fl (ref 78.0–100.0)
Monocytes Absolute: 1.7 10*3/uL — ABNORMAL HIGH (ref 0.1–1.0)
Monocytes Relative: 10.1 % (ref 3.0–12.0)
Neutro Abs: 13.1 10*3/uL — ABNORMAL HIGH (ref 1.4–7.7)
Neutrophils Relative %: 79.7 % — ABNORMAL HIGH (ref 43.0–77.0)
Platelets: 417 10*3/uL — ABNORMAL HIGH (ref 150.0–400.0)
RBC: 4.28 Mil/uL (ref 3.87–5.11)
RDW: 13.7 % (ref 11.5–15.5)
WBC: 16.5 10*3/uL — ABNORMAL HIGH (ref 4.0–10.5)

## 2021-10-03 LAB — POC COVID19 BINAXNOW: SARS Coronavirus 2 Ag: NEGATIVE

## 2021-10-03 LAB — BASIC METABOLIC PANEL
BUN: 11 mg/dL (ref 6–23)
CO2: 21 mEq/L (ref 19–32)
Calcium: 9 mg/dL (ref 8.4–10.5)
Chloride: 99 mEq/L (ref 96–112)
Creatinine, Ser: 0.69 mg/dL (ref 0.40–1.20)
GFR: 116.55 mL/min (ref >60.00)
Glucose, Bld: 94 mg/dL (ref 70–99)
Potassium: 4 mEq/L (ref 3.5–5.1)
Sodium: 132 mEq/L — ABNORMAL LOW (ref 135–145)

## 2021-10-03 LAB — POCT RAPID STREP A (OFFICE): Rapid Strep A Screen: NEGATIVE

## 2021-10-03 LAB — POCT INFLUENZA A/B
Influenza A, POC: NEGATIVE
Influenza B, POC: NEGATIVE

## 2021-10-03 MED ORDER — PROMETHAZINE HCL 25 MG/ML IJ SOLN
25.0000 mg | Freq: Once | INTRAMUSCULAR | Status: AC
  Administered 2021-10-03: 25 mg via INTRAMUSCULAR

## 2021-10-03 MED ORDER — ONDANSETRON 4 MG PO TBDP: 4.0000 mg | ORAL_TABLET | Freq: Three times a day (TID) | ORAL | 0 refills | Status: AC | PRN

## 2021-10-03 MED ORDER — BISACODYL 5 MG PO TBEC: 5.0000 mg | DELAYED_RELEASE_TABLET | ORAL | 0 refills | Status: AC

## 2021-10-03 NOTE — Telephone Encounter (Signed)
letter has been placed on MyChart

## 2021-10-03 NOTE — Telephone Encounter (Signed)
Patient needs a letter stating she cannot go to work today and tomorrow due to the shot she received-  patient aware letter will be on [place],
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
## 2021-10-03 NOTE — Patient Instructions (Addendum)
It was nice to see you today.    We gave you a Phenergan shot today to help with your nausea. I also am sending Zofran pills to take at home as needed for your nausea.  You have to drink as many fluids as possible to avoid dehydration - whatever you can tolerate, gingerale or other carbonated beverages can settle the stomach. Eat a bland diet, with just broth, crackers, plain toast, or plain applesauce.  I also am ordering a CT scan of your abdomen today to check for possible appendicitis. They will call you directly to schedule.    PLEASE NOTE:  If you had any lab tests please let us know if you have not heard back within a few days. You may see your results on MyChart before we have a chance to review them but we will give you a call once they are reviewed by [place]. If we ordered any referrals today, please let us know if you have not heard from their office within the next week.

## 2021-10-04 NOTE — Progress Notes (Signed)
Please call pt - have to call her mom's number, pt number not working. Let her know that the CT scan came up negative for appendicitis, and no other problems found. Ask how she is doing? Is the nausea medicine helping? Did she take the laxative, any BM yet? Is the fever better? Let me know, thanks!

## 2021-10-05 ENCOUNTER — Encounter: Payer: Self-pay | Admitting: Family

## 2021-10-05 DIAGNOSIS — R509 Fever, unspecified: Secondary | ICD-10-CM

## 2021-10-06 ENCOUNTER — Other Ambulatory Visit: Payer: Self-pay | Admitting: Physician Assistant

## 2021-10-06 ENCOUNTER — Other Ambulatory Visit (INDEPENDENT_AMBULATORY_CARE_PROVIDER_SITE_OTHER): Payer: BC Managed Care – PPO

## 2021-10-06 DIAGNOSIS — R1030 Lower abdominal pain, unspecified: Secondary | ICD-10-CM | POA: Diagnosis not present

## 2021-10-06 LAB — COMPREHENSIVE METABOLIC PANEL
ALT: 15 U/L (ref 0–35)
AST: 15 U/L (ref 0–37)
Albumin: 3.8 g/dL (ref 3.5–5.2)
Alkaline Phosphatase: 94 U/L (ref 39–117)
BUN: 6 mg/dL (ref 6–23)
CO2: 26 mEq/L (ref 19–32)
Calcium: 8.8 mg/dL (ref 8.4–10.5)
Chloride: 99 mEq/L (ref 96–112)
Creatinine, Ser: 0.62 mg/dL (ref 0.40–1.20)
GFR: 119.59 mL/min (ref >60.00)
Glucose, Bld: 102 mg/dL — ABNORMAL HIGH (ref 70–99)
Potassium: 3.9 mEq/L (ref 3.5–5.1)
Sodium: 132 mEq/L — ABNORMAL LOW (ref 135–145)
Total Bilirubin: 0.3 mg/dL (ref 0.2–1.2)
Total Protein: 7.9 g/dL (ref 6.0–8.3)

## 2021-10-06 LAB — CBC WITH DIFFERENTIAL/PLATELET
Basophils Absolute: 0 10*3/uL (ref 0.0–0.1)
Basophils Relative: 0.3 % (ref 0.0–3.0)
Eosinophils Absolute: 0 10*3/uL (ref 0.0–0.7)
Eosinophils Relative: 0.2 % (ref 0.0–5.0)
HCT: 34.3 % — ABNORMAL LOW (ref 36.0–46.0)
Hemoglobin: 11.1 g/dL — ABNORMAL LOW (ref 12.0–15.0)
Lymphocytes Relative: 10.8 % — ABNORMAL LOW (ref 12.0–46.0)
Lymphs Abs: 1.6 10*3/uL (ref 0.7–4.0)
MCHC: 32.4 g/dL (ref 30.0–36.0)
MCV: 78.8 fl (ref 78.0–100.0)
Monocytes Absolute: 1.8 10*3/uL — ABNORMAL HIGH (ref 0.1–1.0)
Monocytes Relative: 11.9 % (ref 3.0–12.0)
Neutro Abs: 11.8 10*3/uL — ABNORMAL HIGH (ref 1.4–7.7)
Neutrophils Relative %: 76.8 % (ref 43.0–77.0)
Platelets: 447 10*3/uL — ABNORMAL HIGH (ref 150.0–400.0)
RBC: 4.35 Mil/uL (ref 3.87–5.11)
RDW: 13.8 % (ref 11.5–15.5)
WBC: 15.3 10*3/uL — ABNORMAL HIGH (ref 4.0–10.5)

## 2021-10-06 NOTE — Telephone Encounter (Signed)
labs ordered - can she go somewhere tomorrow, Saturday? Or has to be urgent care?

## 2021-10-06 NOTE — Progress Notes (Signed)
Labs reviewed in a separate MyChart message.

## 2021-10-07 LAB — URINE CULTURE
MICRO NUMBER:: 13891626
Result:: NO GROWTH
SPECIMEN QUALITY:: ADEQUATE

## 2021-10-08 NOTE — Progress Notes (Signed)
Your urine tested negative for any infection. Your blood count is trending down in the right direction, meaning the infection in your body is resolving.  Your sodium level is still a little low, and this originally was from your vomiting. But since this is better, it should be coming back up. Start drinking a gatorade, powerade, pedialyte, V8 juice/splash, etc. just 12oz the next few days then stop. Continue to drink at least 1.5 - 2 liters of water daily. Hope you are still feeling a little better each day. It can take up to a few weeks to get your energy level back. Mardelle Matte questions, let me know!

## 2022-12-08 IMAGING — DX DG HIP (WITH OR WITHOUT PELVIS) 2-3V*L*
3 series · 3 of 3 positions shown · non-contrast
Comparison: None.

CLINICAL DATA: Left hip pain after fall.

EXAM:
DG HIP (WITH OR WITHOUT PELVIS) 2-3V LEFT

[pelvis ap]
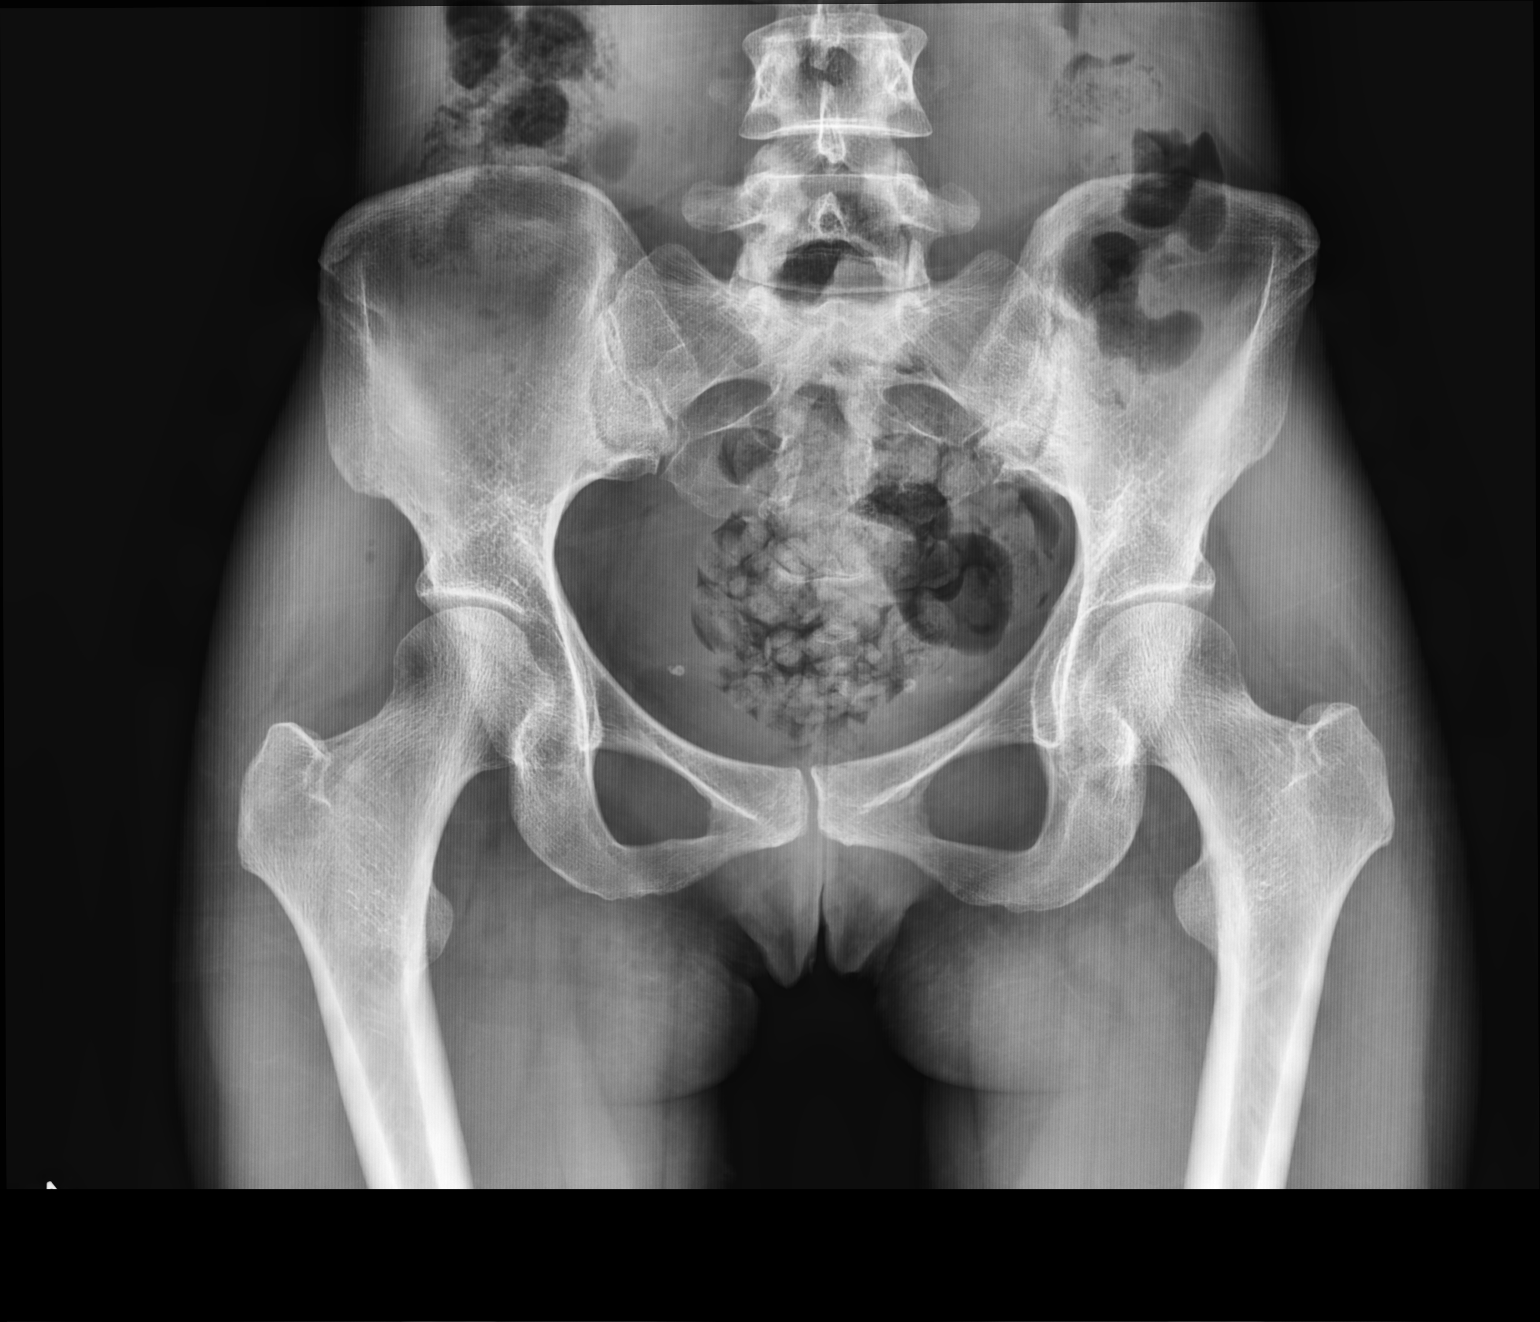

[hip joint ap]
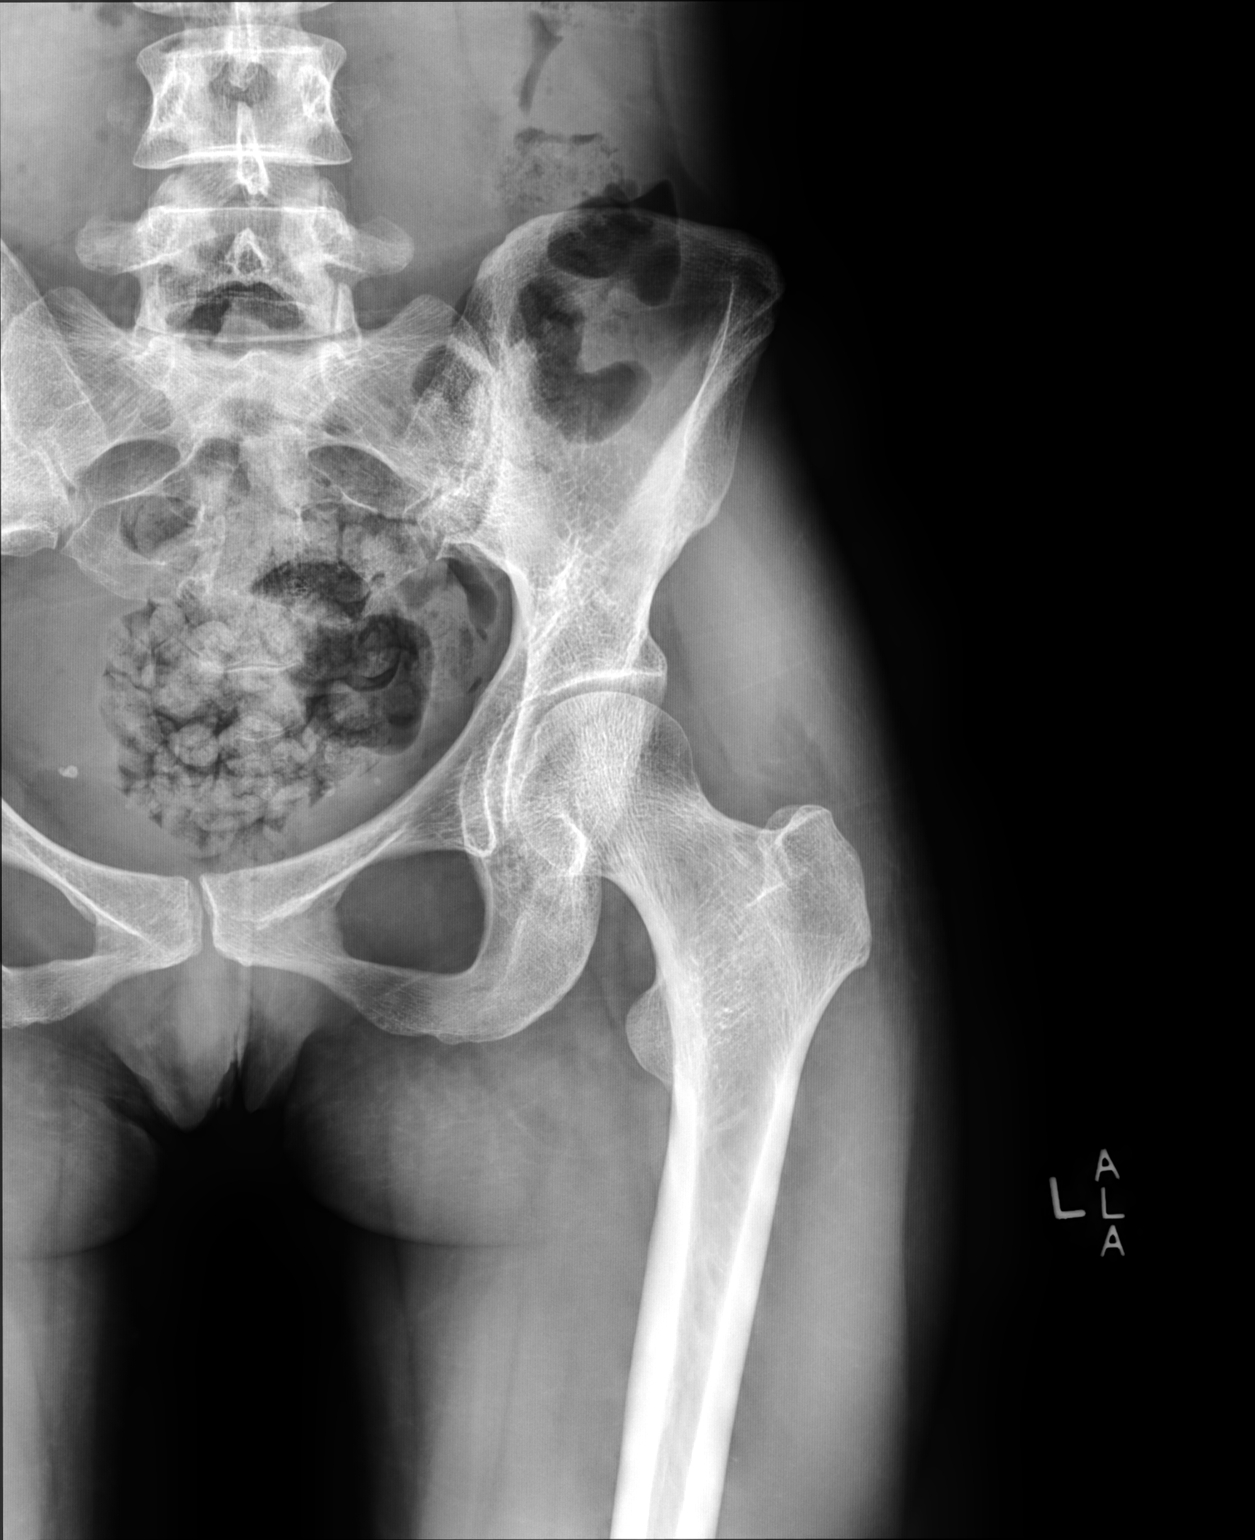

[hip (frog leg) lat]
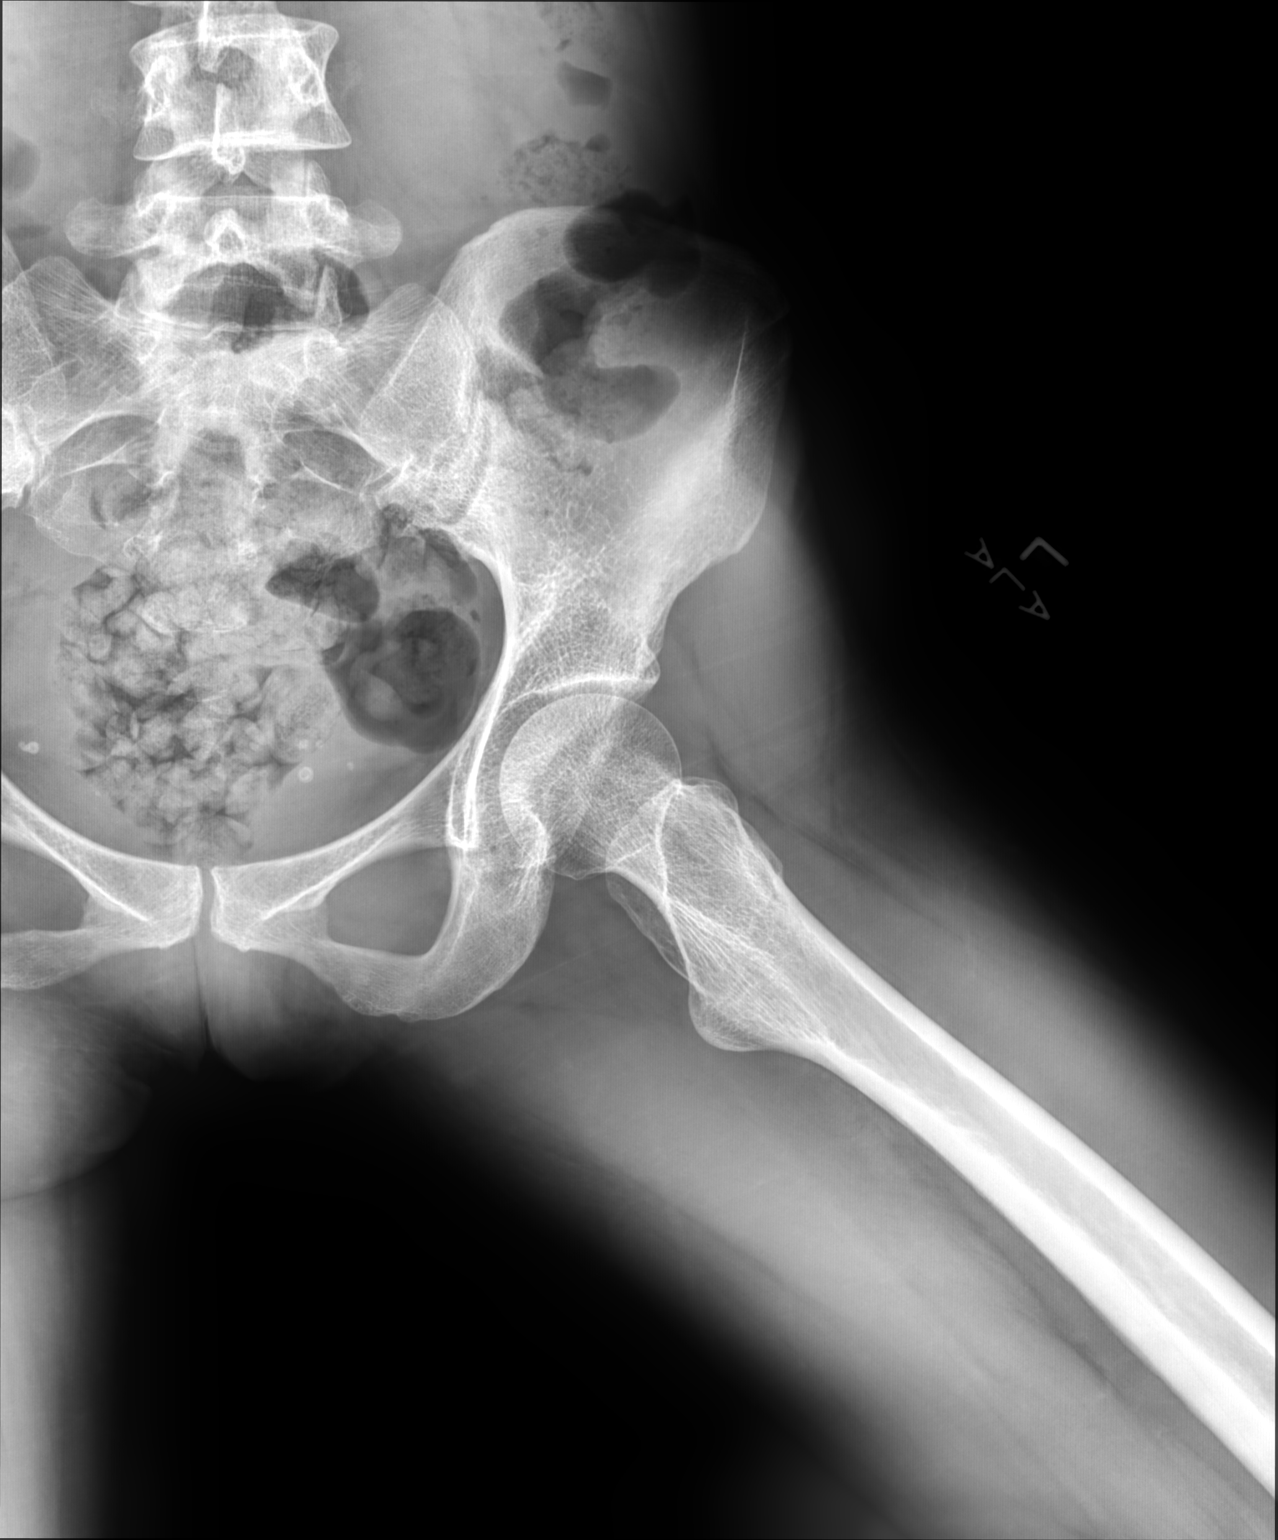

[3 of 3 positions shown; findings below may reference images not displayed]

FINDINGS: There is no evidence of hip fracture or dislocation. There is no
evidence of arthropathy or other focal bone abnormality.
IMPRESSION: Negative.
